# Patient Record
Sex: Male | Born: 1980 | Race: White | Hispanic: No | Marital: Single | Smoking: Current every day smoker
Health system: Southern US, Community
[De-identification: ages and names within clinical notes are randomized; demographics above are authoritative.]

## PROBLEM LIST (undated history)

## (undated) DIAGNOSIS — F32A Depression, unspecified: Secondary | ICD-10-CM

## (undated) DIAGNOSIS — F329 Major depressive disorder, single episode, unspecified: Secondary | ICD-10-CM

## (undated) DIAGNOSIS — F909 Attention-deficit hyperactivity disorder, unspecified type: Secondary | ICD-10-CM

## (undated) DIAGNOSIS — F419 Anxiety disorder, unspecified: Secondary | ICD-10-CM

## (undated) HISTORY — PX: ADENOIDECTOMY: SUR15

## (undated) HISTORY — PX: TONSILLECTOMY: SUR1361

## (undated) HISTORY — PX: KNEE SURGERY: SHX244

---

## 2001-07-15 ENCOUNTER — Emergency Department (HOSPITAL_COMMUNITY): Admission: EM | Admit: 2001-07-15 | Discharge: 2001-07-15 | Payer: Self-pay | Admitting: Emergency Medicine

## 2001-07-15 ENCOUNTER — Encounter: Payer: Self-pay | Admitting: Surgery

## 2001-07-15 ENCOUNTER — Encounter: Payer: Self-pay | Admitting: Emergency Medicine

## 2002-04-21 ENCOUNTER — Encounter: Payer: Self-pay | Admitting: Emergency Medicine

## 2002-04-21 ENCOUNTER — Emergency Department (HOSPITAL_COMMUNITY): Admission: EM | Admit: 2002-04-21 | Discharge: 2002-04-21 | Payer: Self-pay | Admitting: Emergency Medicine

## 2003-02-20 ENCOUNTER — Emergency Department (HOSPITAL_COMMUNITY): Admission: EM | Admit: 2003-02-20 | Discharge: 2003-02-20 | Payer: Self-pay | Admitting: Emergency Medicine

## 2003-02-20 ENCOUNTER — Encounter: Payer: Self-pay | Admitting: Emergency Medicine

## 2004-06-20 ENCOUNTER — Emergency Department (HOSPITAL_COMMUNITY): Admission: EM | Admit: 2004-06-20 | Discharge: 2004-06-20 | Payer: Self-pay | Admitting: Emergency Medicine

## 2005-04-09 IMAGING — CT CT ABDOMEN W/ CM
1 of 4 series · 13 of 32 positions shown, 18 images · non-contrast
Comparison: none

[Series 2: abd/pelvis 5.0 b30f · axial · 0.74mm/px · z∈[-612,-122]mm · 13 of 110 slices shown, 18 images]
[im 6/110  soft-tissue]
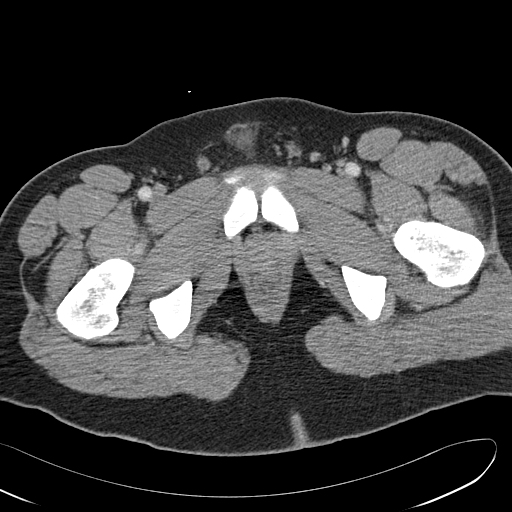
[im 6/110  bone]
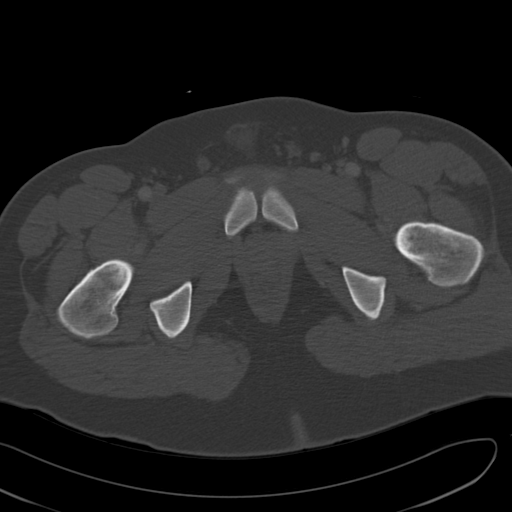
[im 18/110  soft-tissue]
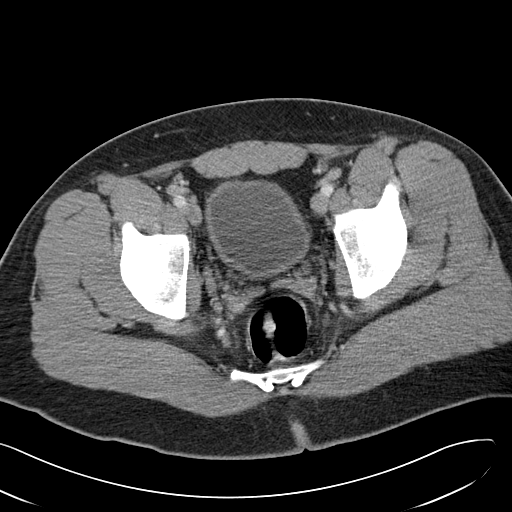
[im 23/110  soft-tissue]
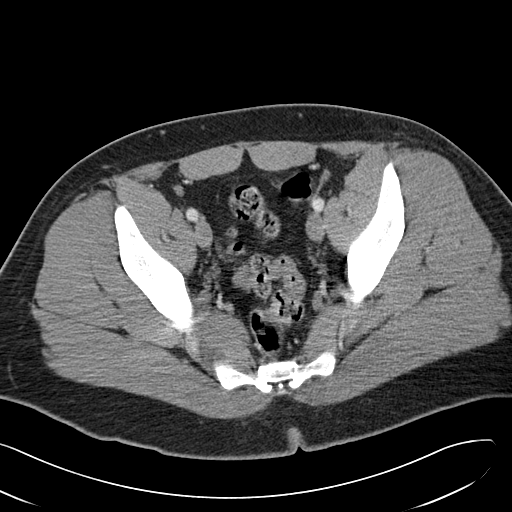
[im 35/110  soft-tissue]
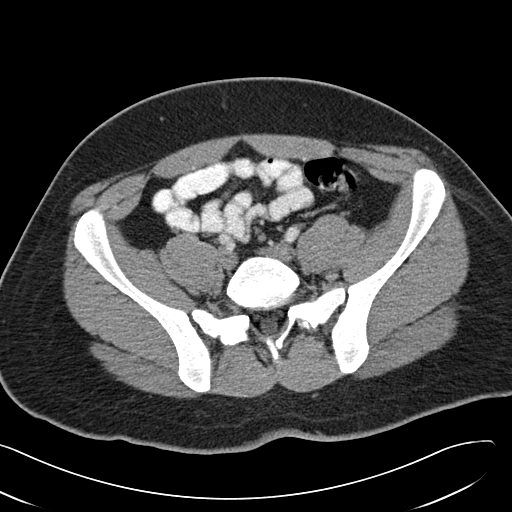
[im 41/110  soft-tissue]
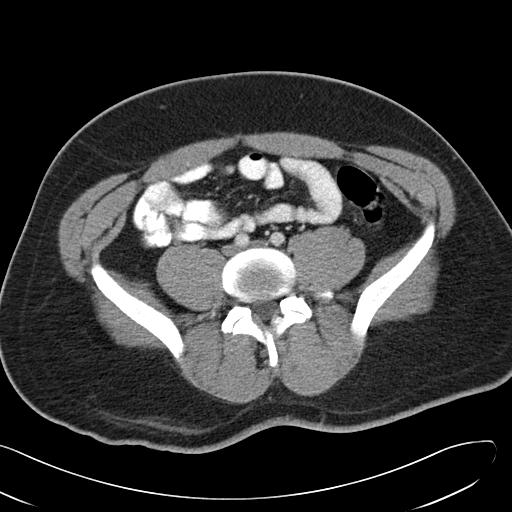
[im 52/110  soft-tissue]
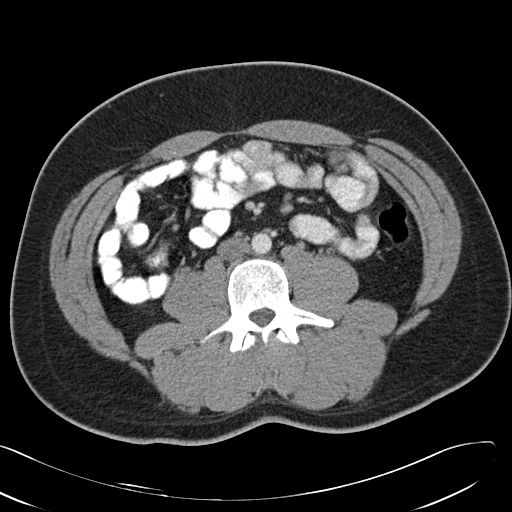
[im 58/110  soft-tissue]
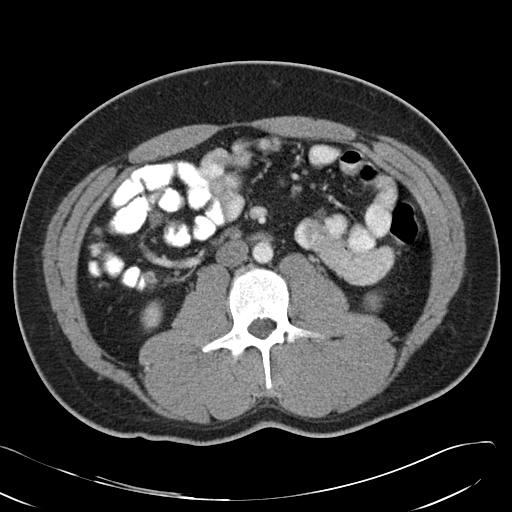
[im 69/110  soft-tissue]
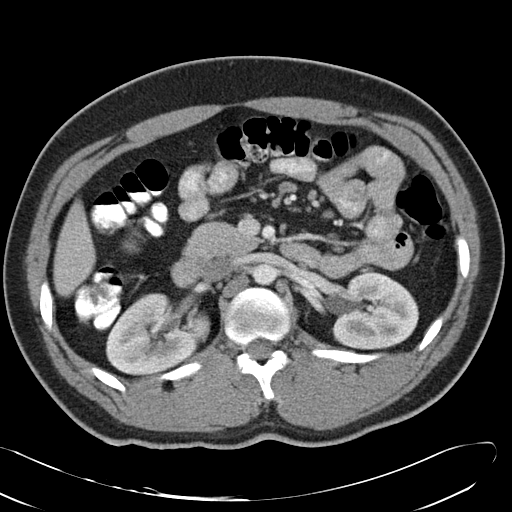
[im 75/110  soft-tissue]
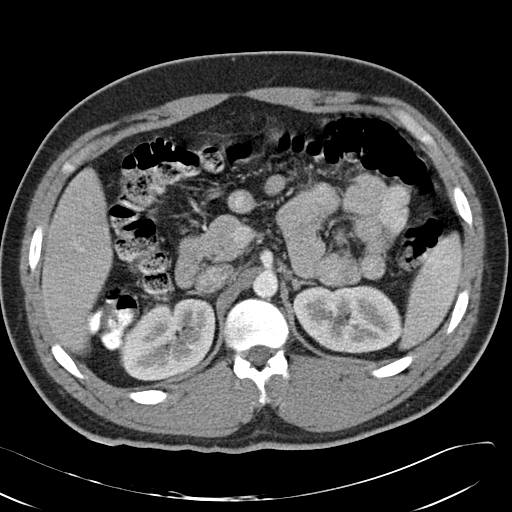
[im 75/110  bone]
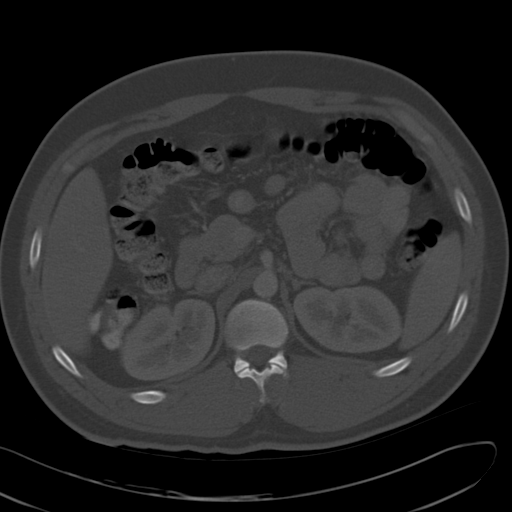
[im 87/110  soft-tissue]
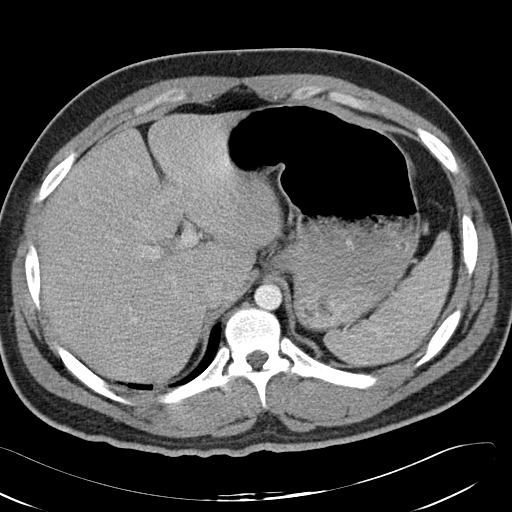
[im 87/110  lung]
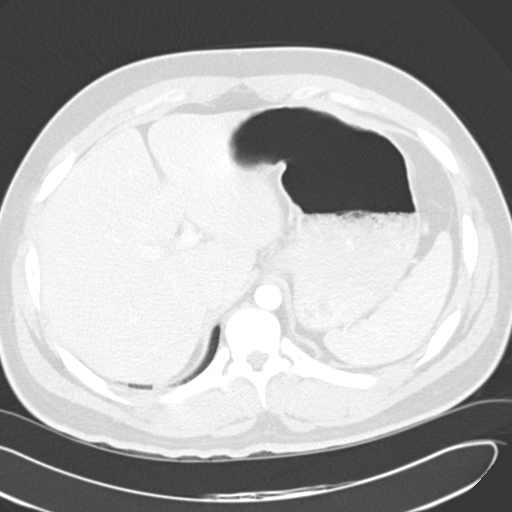
[im 92/110  soft-tissue]
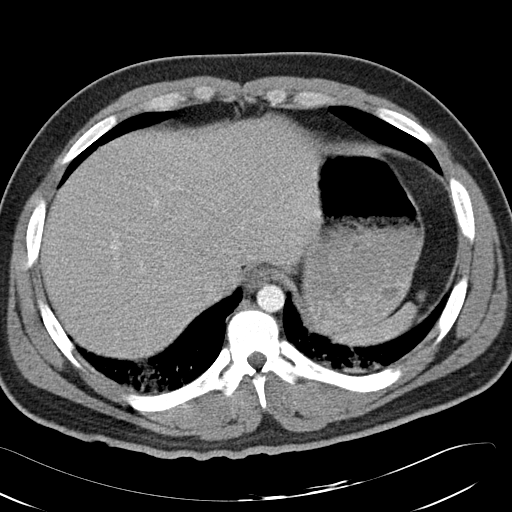
[im 92/110  lung]
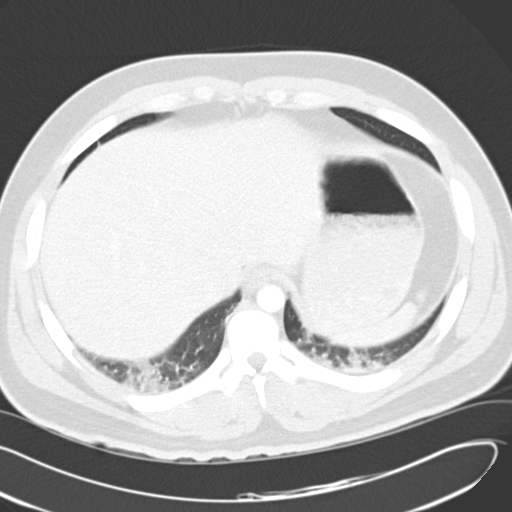
[im 98/110  lung]
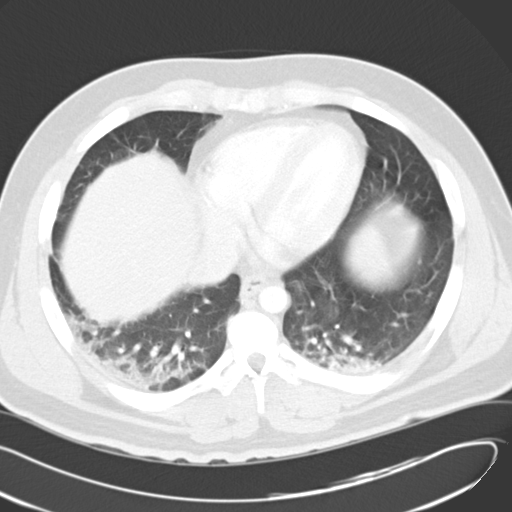
[im 104/110  soft-tissue]
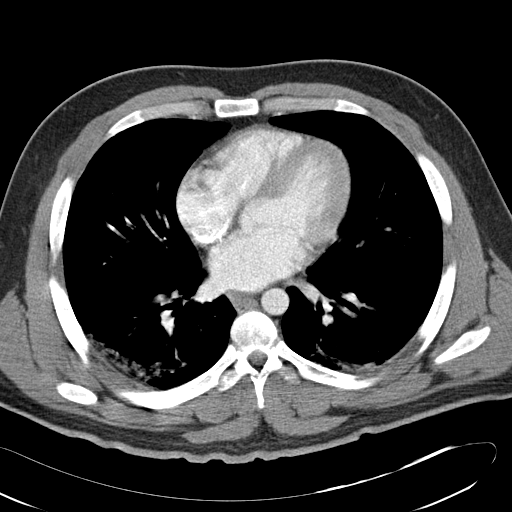
[im 104/110  lung]
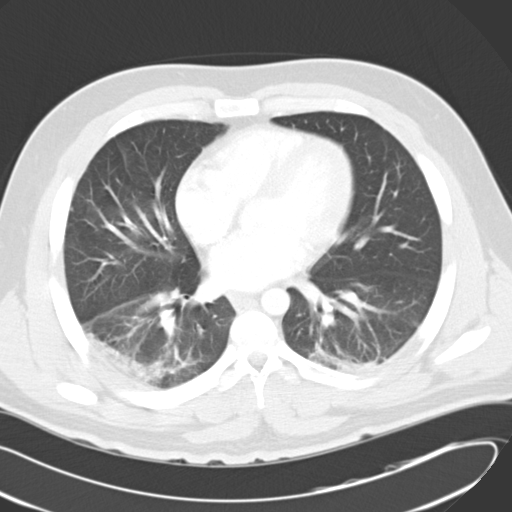

[13 of 32 positions shown; findings below may reference images not displayed]

<!--  IDXRADR:ADDEND:BEGIN --> Addendum Begins
 <!--  IDXRADR:ADDEND:INNER_BEGIN --> Prior CT dated 07/15/01 from [HOSPITAL] has been obtained for comparison.  The small nodule at the right lung base was present previously and does not appear significantly changed.  The basilar atelectasis is new. On the prior study, there were several prominent lymph nodes in the porta hepatis and base of the mesentery.  No gross change is demonstrated.  Findings may reflect chronic mesenteric adenitis.  
 <!--  IDXRADR:ADDEND:INNER_END -->Addendum Ends

 <!--  IDXRADR:ADDEND:END --> Clinical data:  Low abdominal pain.
 CT OF THE ABDOMEN WITH CONTRAST
 Spiral CT after 150 mL Omnipaque 300 IV.  
 Visualized portions of the lung bases demonstrates some patchy dependent atelectasis in the posterior aspect of both lower lobes.  There is a 5 mm nodule in the lateral basal segment right lower lobe image 10.  Unremarkable liver, gallbladder, spleen, adrenal glands, kidneys, pancreas.  Aorta is normal in caliber.  Portal vein is patent.  There is no free air.  No ascites.  Small bowel is nondilated.  No adenopathy is localized.  There are multiple mesenteric lymph nodes evident, none measuring greater than 1.0 cm in short axis diameter.  
 IMPRESSION
 1.  Negative for acute intraabdominal process.
 2.  Nonspecific mesenteric adenopathy.
 3.  Basilar dependent atelectasis with a nonspecific 5 mm nodule in the right lower lobe.  Comparison to previous scan of 07/15/01 once available is recommended to evaluate for stability of this finding.
 CT OF THE PELVIS WITH CONTRAST
 Urinary bladder is incompletely distended, with a somewhat thickened wall.  Colon is decompressed, unremarkable.  The appendix is not discretely identified.  There are multiple subcentimeter lymph nodes in the mesentery just central to the cecum and terminal ileum.  There is no free fluid.  
 IMPRESSION
 Mesenteric adenopathy near the cecum without any evidence of associated bowel abnormality or inflammatory process.

## 2008-02-22 ENCOUNTER — Emergency Department (HOSPITAL_COMMUNITY): Admission: EM | Admit: 2008-02-22 | Discharge: 2008-02-22 | Payer: Self-pay | Admitting: Emergency Medicine

## 2009-01-17 ENCOUNTER — Emergency Department (HOSPITAL_COMMUNITY): Admission: EM | Admit: 2009-01-17 | Discharge: 2009-01-17 | Payer: Self-pay | Admitting: Emergency Medicine

## 2010-12-01 ENCOUNTER — Encounter: Payer: Self-pay | Admitting: Orthopedic Surgery

## 2011-09-04 ENCOUNTER — Emergency Department (HOSPITAL_COMMUNITY)
Admission: EM | Admit: 2011-09-04 | Discharge: 2011-09-04 | Disposition: A | Discharge status: Home / Self Care | Payer: BC Managed Care – PPO | Attending: Emergency Medicine | Admitting: Emergency Medicine

## 2011-09-04 DIAGNOSIS — F988 Other specified behavioral and emotional disorders with onset usually occurring in childhood and adolescence: Secondary | ICD-10-CM | POA: Insufficient documentation

## 2011-09-04 DIAGNOSIS — F329 Major depressive disorder, single episode, unspecified: Secondary | ICD-10-CM | POA: Insufficient documentation

## 2011-09-04 DIAGNOSIS — Z79899 Other long term (current) drug therapy: Secondary | ICD-10-CM | POA: Insufficient documentation

## 2011-09-04 DIAGNOSIS — F3289 Other specified depressive episodes: Secondary | ICD-10-CM | POA: Insufficient documentation

## 2011-09-04 LAB — DIFFERENTIAL
Basophils Relative: 1 % (ref 0–1)
Eosinophils Absolute: 0.4 10*3/uL (ref 0.0–0.7)
Neutrophils Relative %: 71 % (ref 43–77)

## 2011-09-04 LAB — RAPID URINE DRUG SCREEN, HOSP PERFORMED
Amphetamines: NOT DETECTED
Barbiturates: NOT DETECTED
Cocaine: NOT DETECTED
Tetrahydrocannabinol: NOT DETECTED

## 2011-09-04 LAB — COMPREHENSIVE METABOLIC PANEL
AST: 38 U/L — ABNORMAL HIGH (ref 0–37)
Albumin: 4.3 g/dL (ref 3.5–5.2)
Alkaline Phosphatase: 57 U/L (ref 39–117)
Chloride: 104 mEq/L (ref 96–112)
Creatinine, Ser: 0.93 mg/dL (ref 0.50–1.35)
Potassium: 3.8 mEq/L (ref 3.5–5.1)
Total Bilirubin: 0.3 mg/dL (ref 0.3–1.2)

## 2011-09-04 LAB — CBC
Platelets: 228 10*3/uL (ref 150–400)
RBC: 4.96 MIL/uL (ref 4.22–5.81)
WBC: 13.2 10*3/uL — ABNORMAL HIGH (ref 4.0–10.5)

## 2011-09-04 LAB — ETHANOL: Alcohol, Ethyl (B): 117 mg/dL — ABNORMAL HIGH (ref 0–11)

## 2012-06-08 ENCOUNTER — Emergency Department (HOSPITAL_BASED_OUTPATIENT_CLINIC_OR_DEPARTMENT_OTHER)
Admission: EM | Admit: 2012-06-08 | Discharge: 2012-06-08 | Disposition: A | Discharge status: Home / Self Care | Payer: BC Managed Care – PPO | Attending: Emergency Medicine | Admitting: Emergency Medicine

## 2012-06-08 ENCOUNTER — Encounter (HOSPITAL_BASED_OUTPATIENT_CLINIC_OR_DEPARTMENT_OTHER): Payer: Self-pay | Admitting: Emergency Medicine

## 2012-06-08 DIAGNOSIS — H9190 Unspecified hearing loss, unspecified ear: Secondary | ICD-10-CM | POA: Insufficient documentation

## 2012-06-08 DIAGNOSIS — F909 Attention-deficit hyperactivity disorder, unspecified type: Secondary | ICD-10-CM | POA: Insufficient documentation

## 2012-06-08 DIAGNOSIS — H6691 Otitis media, unspecified, right ear: Secondary | ICD-10-CM

## 2012-06-08 DIAGNOSIS — H9209 Otalgia, unspecified ear: Secondary | ICD-10-CM | POA: Insufficient documentation

## 2012-06-08 HISTORY — DX: Depression, unspecified: F32.A

## 2012-06-08 HISTORY — DX: Major depressive disorder, single episode, unspecified: F32.9

## 2012-06-08 HISTORY — DX: Anxiety disorder, unspecified: F41.9

## 2012-06-08 HISTORY — DX: Attention-deficit hyperactivity disorder, unspecified type: F90.9

## 2012-06-08 MED ORDER — CIPROFLOXACIN HCL 500 MG PO TABS: 500.0000 mg | ORAL_TABLET | Freq: Two times a day (BID) | ORAL | Status: AC

## 2012-06-08 MED ORDER — NEOMYCIN-POLYMYXIN-HC 1 % OT SOLN: 3.0000 [drp] | Freq: Four times a day (QID) | OTIC | Status: AC

## 2012-06-08 NOTE — ED Provider Notes (Signed)
History    This chart was scribed for Austin Munch, MD, MD by Smitty Pluck. The patient was seen in room MH05 and the patient's care was started at 7:35PM.   CSN: [id number]  Arrival date & time 06/08/12  1850   First MD Initiated Contact with Patient 06/08/12 1927      Chief Complaint  Patient presents with  . Otalgia  . Hearing Loss    The history is provided by the patient.   Austin Tyler is a 31 y.o. male who presents to the Emergency Department complaining of right otalgia onset 12 days ago with right hearing loss and right eye redness and irritation. Pt reports gradual onset. Pt has taken pain ear drops without relief. Pt reports some yellow drainage of right ear. Denies nausea, vomiting, fever, chills, chest pain, SOB and abdominal pain.Reports having hx ADHD and depression. Symptoms have been constant. Denies radiation.   Past Medical History  Diagnosis Date  . ADHD (attention deficit hyperactivity disorder)   . Anxiety   . Depression     Past Surgical History  Procedure Date  . Knee surgery   . Tonsillectomy   . Adenoidectomy     No family history on file.  History  Substance Use Topics  . Smoking status: Not on file  . Smokeless tobacco: Current User  . Alcohol Use: 0.0 oz/week    1-2 Shots of liquor, 1-2 Cans of beer per week      Review of Systems  Constitutional:       Per HPI, otherwise negative  HENT:       Per HPI, otherwise negative  Eyes: Negative.   Respiratory:       Per HPI, otherwise negative  Cardiovascular:       Per HPI, otherwise negative  Gastrointestinal: Negative for vomiting.  Genitourinary: Negative.   Musculoskeletal:       Per HPI, otherwise negative  Skin: Negative.   Neurological: Negative for syncope.    Allergies  Review of patient's allergies indicates no known allergies.  Home Medications  No current outpatient prescriptions on file.  BP 149/93  Pulse 100  Temp 97.8 F (36.6 C) (Oral)  Ht 6\' 2"  (1.88  m)  Wt 240 lb (108.863 kg)  BMI 30.81 kg/m2  SpO2 98%  Physical Exam  Nursing note and vitals reviewed. Constitutional: He is oriented to person, place, and time. He appears well-developed and well-nourished.  HENT:  Head: Normocephalic and atraumatic.       Effusion of right ear  No mastoid tenderness   Eyes: Conjunctivae and EOM are normal. Pupils are equal, round, and reactive to light.  Neck: Normal range of motion. Neck supple.       Left side cervical lymph palpable but not right   Cardiovascular: Normal rate and regular rhythm.   Pulmonary/Chest: Effort normal and breath sounds normal.  Abdominal: Soft. Bowel sounds are normal.  Musculoskeletal: Normal range of motion.  Neurological: He is alert and oriented to person, place, and time.  Skin: Skin is warm and dry.  Psychiatric: He has a normal mood and affect.    ED Course  Procedures (including critical care time) DIAGNOSTIC STUDIES: Oxygen Saturation is 98% on room air, normal by my interpretation.    COORDINATION OF CARE: 7:40PM EDP discusses pt ED treatment with pt     Labs Reviewed - No data to display No results found.   No diagnosis found.    MDM  I personally performed the  services described in this documentation, which was scribed in my presence. The recorded information has been reviewed and considered.  This is a well male presents with new right ear effusion and hearing loss.  Given the patient's description of onset soon after swimming, there is some suspicion of Pseudomonas contributing to his pathology.  The lack of fever, other evidence of systemic illness or tenderness to the mastoid is reassuring.  We discussed the need for topical and oral antibiotics.  The patient was discharged with ENT followup, return precautions      Austin Munch, MD 06/08/12 2106

## 2012-06-08 NOTE — ED Notes (Signed)
Pt started having right ear pain 12 days ago.  Has had yellow drainage. Now has lost the hearing in that ear.  Some redness in right eye.

## 2014-05-13 ENCOUNTER — Ambulatory Visit (INDEPENDENT_AMBULATORY_CARE_PROVIDER_SITE_OTHER): Payer: BC Managed Care – PPO | Admitting: Emergency Medicine

## 2014-05-13 VITALS — BP 129/81 | HR 82 | Temp 97.8°F | Resp 16 | Ht 73.25 in | Wt 228.2 lb

## 2014-05-13 DIAGNOSIS — G5680 Other specified mononeuropathies of unspecified upper limb: Secondary | ICD-10-CM

## 2014-05-13 DIAGNOSIS — G5681 Other specified mononeuropathies of right upper limb: Secondary | ICD-10-CM

## 2014-05-13 MED ORDER — ACETAMINOPHEN-CODEINE #3 300-30 MG PO TABS: 1.0000 | ORAL_TABLET | ORAL | Status: AC | PRN

## 2014-05-13 MED ORDER — NAPROXEN SODIUM 550 MG PO TABS: 550.0000 mg | ORAL_TABLET | Freq: Two times a day (BID) | ORAL | Status: AC

## 2014-05-13 MED ORDER — CYCLOBENZAPRINE HCL 10 MG PO TABS: 10.0000 mg | ORAL_TABLET | Freq: Three times a day (TID) | ORAL | Status: AC | PRN

## 2014-05-13 NOTE — Progress Notes (Signed)
[facility] [address], [place] [id number] [phone]  Date:  05/13/2014   Name:  Austin Tyler   DOB:  13-Mar-1981   MRN:  [id number]  PCP:  No PCP Per Patient    Chief Complaint: Shoulder Pain   History of Present Illness:  Austin Tyler is a 33 y.o. very pleasant male patient who presents with the following:  Injured shoulder two days ago while moving a table.  No response to motrin.  Missed work yesterday.  No direct injury.  Pain not radiating.  No improvement with over the counter medications or other home remedies. Denies other complaint or health concern today.   There are no active problems to display for this patient.   Past Medical History  Diagnosis Date  . ADHD (attention deficit hyperactivity disorder)   . Anxiety   . Depression     Past Surgical History  Procedure Laterality Date  . Knee surgery    . Tonsillectomy    . Adenoidectomy      History  Substance Use Topics  . Smoking status: Current Every Day Smoker -- 0.50 packs/day  . Smokeless tobacco: Current User  . Alcohol Use: 0.0 oz/week    1-2 Cans of beer, 1-2 Shots of liquor per week    Family History  Problem Relation Age of Onset  . Stroke Mother     No Known Allergies  Medication list has been reviewed and updated.  Current Outpatient Prescriptions on File Prior to Visit  Medication Sig Dispense Refill  . ALPRAZolam (XANAX) 1 MG tablet Take 2 mg by mouth at bedtime as needed.      Marland Kitchen. amphetamine-dextroamphetamine (ADDERALL XR) 20 MG 24 hr capsule Take 20 mg by mouth every morning.      . Isopropyl Alcohol (SWIMMERS EAR DROPS OT) Place 2 drops in ear(s) daily as needed. For swimmers ear.      . mirtazapine (REMERON) 30 MG tablet Take 30 mg by mouth at bedtime.      . naproxen sodium (ANAPROX) 220 MG tablet Take 220 mg by mouth 2 (two) times daily with a meal. For pain.      Marland Kitchen. NEOMYCIN-POLYMYXIN-HC, OTIC, (CORTISPORIN) 1 % SOLN Place 3 drops into the right ear  every 6 (six) hours.  1 Bottle  0  . pseudoephedrine (SUDAFED) 30 MG tablet Take 30 mg by mouth every 4 (four) hours as needed.       No current facility-administered medications on file prior to visit.    Review of Systems:  As per HPI, otherwise negative.    Physical Examination: Filed Vitals:   05/13/14 1113  BP: 129/81  Pulse: 82  Temp: 97.8 F (36.6 C)  Resp: 16   Filed Vitals:   05/13/14 1113  Height: 6' 1.25" (1.861 m)  Weight: 228 lb 3.2 oz (103.511 kg)   Body mass index is 29.89 kg/(m^2). Ideal Body Weight: Weight in (lb) to have BMI = 25: 190.4   GEN: WDWN, NAD, Non-toxic, Alert & Oriented x 3 HEENT: Atraumatic, Normocephalic.  Ears and Nose: No external deformity. EXTR: No clubbing/cyanosis/edema NEURO: Normal gait.  PSYCH: Normally interactive. Conversant. Not depressed or anxious appearing.  Calm demeanor.  RIGHT shoulder:  Tender posterior shoulder medial and superior to angle of scapula. Neuro intact   Assessment and Plan: Scapulocostal syndrome Anaprox Flexeril tyl #3  Signed,  Phillips OdorJeffery Caroline Longie, MD
